# Patient Record
Sex: Male | Born: 2005
Health system: Southern US, Community
[De-identification: ages and names within clinical notes are randomized; demographics above are authoritative.]

## PROBLEM LIST (undated history)

## (undated) DIAGNOSIS — N433 Hydrocele, unspecified: Secondary | ICD-10-CM

## (undated) DIAGNOSIS — S022XXA Fracture of nasal bones, initial encounter for closed fracture: Secondary | ICD-10-CM

## (undated) HISTORY — DX: Hydrocele, unspecified: N43.3

## (undated) HISTORY — PX: HYDROCELE EXCISION / REPAIR: SUR1145

---
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
## 2024-02-03 ENCOUNTER — Telehealth: Payer: Self-pay | Admitting: Pediatrics

## 2024-02-03 NOTE — Telephone Encounter (Signed)
 Sports physical forms emailed over to be completed. Forms placed in Dr.Ram's office.   Will email the forms back to [email or web address] once completed.

## 2024-02-05 NOTE — Telephone Encounter (Signed)
Forms e-mailed back and placed up front in patient folders.

## 2024-05-02 NOTE — Progress Notes (Signed)
 05/03/2024 1:38 PM   Raymond Newton [birth year] [id number]  Referring provider: Hadassah Letters, MD [address] [place],  [place] [address]  No chief complaint on file.   HPI: 18 year old recently graduated high school senior (attending [employer]  next year) self-referred for problems with his foreskin.  He has had intermittent problems retracting his foreskin, as well as moderate inflammation at times.  In this active 18 year old, this is bothersome.   PMH: Past Medical History:  Diagnosis Date   Closed fracture nasal bone    Hydrocele of testis birth    Surgical History: Past Surgical History:  Procedure Laterality Date   CLOSED REDUCTION NASAL FRACTURE N/A 10/03/2020   Procedure: CLOSED REDUCTION NASAL FRACTURE;  Surgeon: Thornell Flirt, DO;  Location: [facility];  Service: Plastics;  Laterality: N/A;   HYDROCELE EXCISION / REPAIR      Home Medications:  Allergies as of 05/03/2024   No Known Allergies      Medication List    as of May 02, 2024  1:38 PM   You have not been prescribed any medications.     Allergies: No Known Allergies  Family History: Family History  Problem Relation Age of Onset   Cancer Paternal Grandmother        breast   Alcohol abuse Neg Hx    Arthritis Neg Hx    Asthma Neg Hx    Birth defects Neg Hx    COPD Neg Hx    Depression Neg Hx    Drug abuse Neg Hx    Early death Neg Hx    Hearing loss Neg Hx    Hyperlipidemia Neg Hx    Kidney disease Neg Hx    Learning disabilities Neg Hx    Mental illness Neg Hx    Mental retardation Neg Hx    Miscarriages / Stillbirths Neg Hx    Stroke Neg Hx    Vision loss Neg Hx    Varicose Veins Neg Hx    Heart disease Neg Hx    Hypertension Neg Hx     Social History:  reports that he has never smoked. He has never used smokeless tobacco. He reports that he does not drink alcohol and does not use drugs.  ROS: All other review of systems were  reviewed and are negative except what is noted above in HPI  Physical Exam: There were no vitals taken for this visit.  Constitutional:  Alert and oriented, No acute distress. HEENT: [place] AT, moist mucus membranes.  Trachea midline, no masses. Cardiovascular: No clubbing, cyanosis, or edema. Respiratory: Normal respiratory effort, no increased work of breathing. GI: No inguinal hernias GU: Phallus uncircumcised.  Mild phimosis.  No balanitis noted.  Scrotal skin normal.  Testicles, cord and epididymal structures normal. Lymph: No cervical or inguinal lymphadenopathy. Skin: No rashes, bruises or suspicious lesions. Neurologic: Grossly intact, no focal deficits, moving all 4 extremities. Psychiatric: Normal mood and affect.  Laboratory Data:    Assessment & Plan:   Phimosis with history of balanitis.  Patient desires to have circumcision.  I discussed circumcision with loop and his dad, Myrtie Atkinson.  We talked about the procedure, expected recovery, risks and complications.  He would like to move forward with performing a circumcision.  We will see about doing this in [place] at [facility]. There are no diagnoses linked to this encounter.  No follow-ups on file.  Roque Collar, MD  [facility]

## 2024-05-03 ENCOUNTER — Ambulatory Visit (INDEPENDENT_AMBULATORY_CARE_PROVIDER_SITE_OTHER): Payer: Self-pay | Admitting: Urology

## 2024-05-03 VITALS — BP 123/75 | HR 61

## 2024-05-03 DIAGNOSIS — N471 Phimosis: Secondary | ICD-10-CM | POA: Diagnosis not present

## 2024-05-03 DIAGNOSIS — Z87438 Personal history of other diseases of male genital organs: Secondary | ICD-10-CM

## 2024-05-04 ENCOUNTER — Other Ambulatory Visit: Payer: Self-pay

## 2024-05-04 DIAGNOSIS — N471 Phimosis: Secondary | ICD-10-CM

## 2024-05-12 NOTE — Patient Instructions (Addendum)
 SURGICAL WAITING ROOM VISITATION  Patients having surgery or a procedure may have no more than 2 support people in the waiting area - these visitors may rotate.    Children under the age of 50 must have an adult with them who is not the patient.  Visitors with respiratory illnesses are discouraged from visiting and should remain at home.  If the patient needs to stay at the hospital during part of their recovery, the visitor guidelines for inpatient rooms apply. Pre-op nurse will coordinate an appropriate time for 1 support person to accompany patient in pre-op.  This support person may not rotate.    Please refer to the [facility] website for the visitor guidelines for Inpatients (after your surgery is over and you are in a regular room).    Your procedure is scheduled on: 05/16/24   Report to [facility] Main Entrance    Report to admitting at 7:20 AM   Call this number if you have problems the morning of surgery [phone]   Do not eat food or drink liquids :After Midnight.         If you have questions, please contact your surgeon's office.   FOLLOW BOWEL PREP AND ANY ADDITIONAL PRE OP INSTRUCTIONS YOU RECEIVED FROM YOUR SURGEON'S OFFICE!!!     Oral Hygiene is also important to reduce your risk of infection.                                    Remember - BRUSH YOUR TEETH THE MORNING OF SURGERY WITH YOUR REGULAR TOOTHPASTE  DENTURES WILL BE REMOVED PRIOR TO SURGERY PLEASE DO NOT APPLY Poly grip OR ADHESIVES!!!   Stop all vitamins and herbal supplements 7 days before surgery.   Take these medicines the morning of surgery with A SIP OF WATER: none             You may not have any metal on your body including hair pins, jewelry, and body piercing             Do not wear make-up, lotions, powders, perfumes/cologne, or deodorant              Men may shave face and neck.   Do not bring valuables to the hospital. [facility] IS NOT             RESPONSIBLE   FOR  VALUABLES.   Contacts, glasses, dentures or bridgework may not be worn into surgery.   Bring small overnight bag day of surgery.   DO NOT BRING YOUR HOME MEDICATIONS TO THE HOSPITAL. PHARMACY WILL DISPENSE MEDICATIONS LISTED ON YOUR MEDICATION LIST TO YOU DURING YOUR ADMISSION IN THE HOSPITAL!    Patients discharged on the day of surgery will not be allowed to drive home.  Someone NEEDS to stay with you for the first 24 hours after anesthesia.              Please read over the following fact sheets you were given: IF YOU HAVE QUESTIONS ABOUT YOUR PRE-OP INSTRUCTIONS PLEASE CALL [phone]- Martin Slay   If you received a COVID test during your pre-op visit  it is requested that you wear a mask when out in public, stay away from anyone that may not be feeling well and notify your surgeon if you develop symptoms. If you test positive for Covid or have been in contact with anyone that has  tested positive in the last 10 days please notify you surgeon.    [facility] - Preparing for Surgery Before surgery, you can play an important role.  Because skin is not sterile, your skin needs to be as free of germs as possible.  You can reduce the number of germs on your skin by washing with CHG (chlorahexidine gluconate) soap before surgery.  CHG is an antiseptic cleaner which kills germs and bonds with the skin to continue killing germs even after washing. Please DO NOT use if you have an allergy to CHG or antibacterial soaps.  If your skin becomes reddened/irritated stop using the CHG and inform your nurse when you arrive at Short Stay. Do not shave (including legs and underarms) for at least 48 hours prior to the first CHG shower.  You may shave your face/neck.  Please follow these instructions carefully:  1.  Shower with CHG Soap the night before surgery and the  morning of surgery.  2.  If you choose to wash your hair, wash your hair first as usual with your normal  shampoo.  3.  After you  shampoo, rinse your hair and body thoroughly to remove the shampoo.                             4.  Use CHG as you would any other liquid soap.  You can apply chg directly to the skin and wash.  Gently with a scrungie or clean washcloth.  5.  Apply the CHG Soap to your body ONLY FROM THE NECK DOWN.   Do   not use on face/ open                           Wound or open sores. Avoid contact with eyes, ears mouth and   genitals (private parts).                       Wash face,  Genitals (private parts) with your normal soap.             6.  Wash thoroughly, paying special attention to the area where your    surgery  will be performed.  7.  Thoroughly rinse your body with warm water from the neck down.  8.  DO NOT shower/wash with your normal soap after using and rinsing off the CHG Soap.                9.  Pat yourself dry with a clean towel.            10.  Wear clean pajamas.            11.  Place clean sheets on your bed the night of your first shower and do not  sleep with pets. Day of Surgery : Do not apply any lotions/deodorants the morning of surgery.  Please wear clean clothes to the hospital/surgery center.  FAILURE TO FOLLOW THESE INSTRUCTIONS MAY RESULT IN THE CANCELLATION OF YOUR SURGERY  PATIENT SIGNATURE_________________________________  NURSE SIGNATURE__________________________________  ________________________________________________________________________

## 2024-05-12 NOTE — Progress Notes (Addendum)
 COVID Vaccine Completed: yes  Date of COVID positive in last 90 days:  PCP - Hadassah Letters, MD Cardiologist - n/a  Chest x-Eisenmenger - n/a EKG - n/a Stress Test - n/a ECHO - n/a Cardiac Cath - n/a Pacemaker/ICD device last checked: n/a Spinal Cord Stimulator:n/a  Bowel Prep - no  Sleep Study - n/a CPAP -   Fasting Blood Sugar -  Checks Blood Sugar _____ times a day  Last dose of GLP1 agonist-  N/A GLP1 instructions:  Hold 7 days before surgery    Last dose of SGLT-2 inhibitors-  N/A SGLT-2 instructions:  Hold 3 days before surgery    Blood Thinner Instructions:  Last dose:  n/a  Time: Aspirin Instructions: Last Dose:  Activity level: Can go up a flight of stairs and perform activities of daily living without stopping and without symptoms of chest pain or shortness of breath.   Anesthesia review:   Patient denies shortness of breath, fever, cough and chest pain at PAT appointment  Patient verbalized understanding of instructions that were given to them at the PAT appointment. Patient was also instructed that they will need to review over the PAT instructions again at home before surgery.

## 2024-05-13 ENCOUNTER — Other Ambulatory Visit: Payer: Self-pay

## 2024-05-13 ENCOUNTER — Encounter (HOSPITAL_COMMUNITY)
Admission: RE | Admit: 2024-05-13 | Discharge: 2024-05-13 | Disposition: A | Discharge status: Home / Self Care | Source: Ambulatory Visit | Attending: Urology | Admitting: Urology

## 2024-05-13 ENCOUNTER — Encounter (HOSPITAL_COMMUNITY): Payer: Self-pay

## 2024-05-13 VITALS — BP 127/57 | HR 57 | Temp 98.6°F | Resp 14 | Ht 75.0 in | Wt 189.0 lb

## 2024-05-13 DIAGNOSIS — N471 Phimosis: Secondary | ICD-10-CM | POA: Diagnosis present

## 2024-05-13 DIAGNOSIS — Z01812 Encounter for preprocedural laboratory examination: Secondary | ICD-10-CM | POA: Insufficient documentation

## 2024-05-13 DIAGNOSIS — Z01818 Encounter for other preprocedural examination: Secondary | ICD-10-CM

## 2024-05-15 NOTE — H&P (Signed)
 H&P  Chief Complaint: phimosis  History of Present Illness: 18 yo male presents for circumcision for moderate symptomatic phimosis.  Past Medical History:  Diagnosis Date   Closed fracture nasal bone    Hydrocele of testis birth    Past Surgical History:  Procedure Laterality Date   CLOSED REDUCTION NASAL FRACTURE N/A 10/03/2020   Procedure: CLOSED REDUCTION NASAL FRACTURE;  Surgeon: Thornell Flirt, DO;  Location: [facility];  Service: Plastics;  Laterality: N/A;   HYDROCELE EXCISION / REPAIR     WISDOM TOOTH EXTRACTION      Home Medications:  Allergies as of 05/15/2024   No Known Allergies      Medication List      Notice   Cannot display discharge medications because the patient has not yet been admitted.     Allergies: No Known Allergies  Family History  Problem Relation Age of Onset   Cancer Paternal Grandmother        breast   Alcohol abuse Neg Hx    Arthritis Neg Hx    Asthma Neg Hx    Birth defects Neg Hx    COPD Neg Hx    Depression Neg Hx    Drug abuse Neg Hx    Early death Neg Hx    Hearing loss Neg Hx    Hyperlipidemia Neg Hx    Kidney disease Neg Hx    Learning disabilities Neg Hx    Mental illness Neg Hx    Mental retardation Neg Hx    Miscarriages / Stillbirths Neg Hx    Stroke Neg Hx    Vision loss Neg Hx    Varicose Veins Neg Hx    Heart disease Neg Hx    Hypertension Neg Hx     Social History:  reports that he has never smoked. He has never used smokeless tobacco. He reports that he does not drink alcohol and does not use drugs.  ROS: A complete review of systems was performed.  All systems are negative except for pertinent findings as noted.  Physical Exam:  Vital signs in last 24 hours: There were no vitals taken for this visit. Constitutional:  Alert and oriented, No acute distress Cardiovascular: Regular rate  Respiratory: Normal respiratory effort GI: Abdomen is soft, nontender, nondistended, no  abdominal masses. No CVAT.  Genitourinary: Phimotic foreskin. Lymphatic: No lymphadenopathy Neurologic: Grossly intact, no focal deficits Psychiatric: Normal mood and affect  I have reviewed prior pt notes     Impression/Assessment:  Phimosis  Plan:  Circumcision

## 2024-05-16 ENCOUNTER — Encounter (HOSPITAL_COMMUNITY): Payer: Self-pay | Admitting: Urology

## 2024-05-16 ENCOUNTER — Ambulatory Visit (HOSPITAL_COMMUNITY): Admitting: Anesthesiology

## 2024-05-16 ENCOUNTER — Ambulatory Visit (HOSPITAL_COMMUNITY): Payer: Self-pay | Admitting: Physician Assistant

## 2024-05-16 ENCOUNTER — Encounter (HOSPITAL_COMMUNITY)
Admission: RE | Disposition: A | Discharge status: Home / Self Care | Payer: Self-pay | Source: Home / Self Care | Attending: Urology

## 2024-05-16 ENCOUNTER — Ambulatory Visit (HOSPITAL_COMMUNITY)
Admission: RE | Admit: 2024-05-16 | Discharge: 2024-05-16 | Disposition: A | Discharge status: Home / Self Care | Attending: Urology | Admitting: Urology

## 2024-05-16 DIAGNOSIS — N471 Phimosis: Secondary | ICD-10-CM | POA: Insufficient documentation

## 2024-05-16 HISTORY — PX: CIRCUMCISION: SHX1350

## 2024-05-16 SURGERY — CIRCUMCISION, ADULT
Anesthesia: General | Site: Penis

## 2024-05-16 MED ORDER — LACTATED RINGERS IV SOLN: INTRAVENOUS | Status: DC

## 2024-05-16 MED ORDER — FENTANYL CITRATE PF 50 MCG/ML IJ SOSY: 25.0000 ug | PREFILLED_SYRINGE | INTRAMUSCULAR | Status: DC | PRN

## 2024-05-16 MED ORDER — ONDANSETRON HCL 4 MG/2ML IJ SOLN
INTRAMUSCULAR | Status: AC
  Filled 2024-05-16: qty 2

## 2024-05-16 MED ORDER — LIDOCAINE HCL (PF) 1 % IJ SOLN
INTRAMUSCULAR | Status: AC
Start: 2024-05-16 — End: 2024-05-16
  Filled 2024-05-16: qty 30

## 2024-05-16 MED ORDER — PROPOFOL 10 MG/ML IV BOLUS
INTRAVENOUS | Status: AC
  Filled 2024-05-16: qty 20

## 2024-05-16 MED ORDER — FENTANYL CITRATE (PF) 100 MCG/2ML IJ SOLN
INTRAMUSCULAR | Status: DC | PRN
  Administered 2024-05-16 (×2): 50 ug via INTRAVENOUS

## 2024-05-16 MED ORDER — BUPIVACAINE HCL (PF) 0.25 % IJ SOLN
INTRAMUSCULAR | Status: DC | PRN
  Administered 2024-05-16: 8 mL

## 2024-05-16 MED ORDER — PROPOFOL 10 MG/ML IV BOLUS
INTRAVENOUS | Status: DC | PRN
  Administered 2024-05-16: 200 mg via INTRAVENOUS

## 2024-05-16 MED ORDER — LIDOCAINE HCL (PF) 2 % IJ SOLN
INTRAMUSCULAR | Status: DC | PRN
  Administered 2024-05-16: 80 mg via INTRADERMAL

## 2024-05-16 MED ORDER — ONDANSETRON HCL 4 MG/2ML IJ SOLN
INTRAMUSCULAR | Status: DC | PRN
  Administered 2024-05-16: 4 mg via INTRAVENOUS

## 2024-05-16 MED ORDER — LIDOCAINE HCL (PF) 2 % IJ SOLN
INTRAMUSCULAR | Status: AC
  Filled 2024-05-16: qty 5

## 2024-05-16 MED ORDER — AMISULPRIDE (ANTIEMETIC) 5 MG/2ML IV SOLN
10.0000 mg | Freq: Once | INTRAVENOUS | Status: DC | PRN
Start: 2024-05-16 — End: 2024-05-16

## 2024-05-16 MED ORDER — FENTANYL CITRATE (PF) 100 MCG/2ML IJ SOLN
INTRAMUSCULAR | Status: AC
Start: 2024-05-16 — End: 2024-05-16
  Filled 2024-05-16: qty 2

## 2024-05-16 MED ORDER — DEXMEDETOMIDINE HCL IN NACL 80 MCG/20ML IV SOLN
INTRAVENOUS | Status: DC | PRN
  Administered 2024-05-16: 8 ug via INTRAVENOUS

## 2024-05-16 MED ORDER — OXYCODONE HCL 5 MG/5ML PO SOLN: 5.0000 mg | Freq: Once | ORAL | Status: DC | PRN

## 2024-05-16 MED ORDER — OXYCODONE HCL 5 MG PO TABS: 5.0000 mg | ORAL_TABLET | Freq: Once | ORAL | Status: DC | PRN

## 2024-05-16 MED ORDER — ORAL CARE MOUTH RINSE
15.0000 mL | Freq: Once | OROMUCOSAL | Status: AC
Start: 2024-05-16 — End: 2024-05-16

## 2024-05-16 MED ORDER — CEFAZOLIN SODIUM-DEXTROSE 2-4 GM/100ML-% IV SOLN
2.0000 g | INTRAVENOUS | Status: AC
  Filled 2024-05-16: qty 100

## 2024-05-16 MED ORDER — LIDOCAINE HCL (PF) 1 % IJ SOLN
INTRAMUSCULAR | Status: DC | PRN
  Administered 2024-05-16: 8 mL

## 2024-05-16 MED ORDER — 0.9 % SODIUM CHLORIDE (POUR BTL) OPTIME
TOPICAL | Status: DC | PRN
  Administered 2024-05-16: 1000 mL

## 2024-05-16 MED ORDER — MIDAZOLAM HCL 2 MG/2ML IJ SOLN
INTRAMUSCULAR | Status: DC | PRN
  Administered 2024-05-16: 2 mg via INTRAVENOUS

## 2024-05-16 MED ORDER — DEXAMETHASONE SODIUM PHOSPHATE 10 MG/ML IJ SOLN
INTRAMUSCULAR | Status: AC
  Filled 2024-05-16: qty 1

## 2024-05-16 MED ORDER — DEXAMETHASONE SODIUM PHOSPHATE 10 MG/ML IJ SOLN
INTRAMUSCULAR | Status: DC | PRN
  Administered 2024-05-16: 4 mg via INTRAVENOUS

## 2024-05-16 MED ORDER — CHLORHEXIDINE GLUCONATE 0.12 % MT SOLN
15.0000 mL | Freq: Once | OROMUCOSAL | Status: AC
  Administered 2024-05-16: 15 mL via OROMUCOSAL

## 2024-05-16 MED ORDER — TRAMADOL HCL 50 MG PO TABS: 50.0000 mg | ORAL_TABLET | Freq: Four times a day (QID) | ORAL | 0 refills | Status: AC | PRN

## 2024-05-16 MED ORDER — ACETAMINOPHEN 500 MG PO TABS
1000.0000 mg | ORAL_TABLET | Freq: Once | ORAL | Status: AC
  Administered 2024-05-16: 1000 mg via ORAL
  Filled 2024-05-16: qty 2

## 2024-05-16 MED ORDER — DEXMEDETOMIDINE HCL IN NACL 80 MCG/20ML IV SOLN
INTRAVENOUS | Status: AC
  Filled 2024-05-16: qty 20

## 2024-05-16 MED ORDER — BUPIVACAINE HCL (PF) 0.25 % IJ SOLN
INTRAMUSCULAR | Status: AC
  Filled 2024-05-16: qty 30

## 2024-05-16 MED ORDER — MIDAZOLAM HCL 2 MG/2ML IJ SOLN
INTRAMUSCULAR | Status: AC
Start: 2024-05-16 — End: 2024-05-16
  Filled 2024-05-16: qty 2

## 2024-05-16 SURGICAL SUPPLY — 24 items
BAG COUNTER SPONGE SURGICOUNT (BAG) IMPLANT
BLADE SURG 15 STRL LF DISP TIS (BLADE) ×1 IMPLANT
BNDG COHESIVE 1.5X5 TAN NS LF (GAUZE/BANDAGES/DRESSINGS) ×1 IMPLANT
BNDG COHESIVE 2X5 TAN ST LF (GAUZE/BANDAGES/DRESSINGS) IMPLANT
BNDG STRETCH 4X75 STRL LF (GAUZE/BANDAGES/DRESSINGS) IMPLANT
COVER SURGICAL LIGHT HANDLE (MISCELLANEOUS) ×1 IMPLANT
DRAPE LAPAROTOMY T 102X78X121 (DRAPES) ×1 IMPLANT
ELECT NDL TIP 2.8 STRL (NEEDLE) IMPLANT
ELECT NEEDLE TIP 2.8 STRL (NEEDLE) IMPLANT
ELECT REM PT RETURN 15FT ADLT (MISCELLANEOUS) ×1 IMPLANT
GAUZE 4X4 16PLY ~~LOC~~+RFID DBL (SPONGE) ×1 IMPLANT
GAUZE PETROLATUM 1 X8 (GAUZE/BANDAGES/DRESSINGS) ×1 IMPLANT
GAUZE STRETCH 2X75IN STRL (MISCELLANEOUS) ×1 IMPLANT
GLOVE SURG LX STRL 8.0 MICRO (GLOVE) ×1 IMPLANT
GOWN STRL REUS W/ TWL XL LVL3 (GOWN DISPOSABLE) ×1 IMPLANT
KIT BASIN OR (CUSTOM PROCEDURE TRAY) ×1 IMPLANT
KIT TURNOVER KIT A (KITS) ×2 IMPLANT
NDL HYPO 22X1.5 SAFETY MO (MISCELLANEOUS) IMPLANT
NEEDLE HYPO 22X1.5 SAFETY MO (MISCELLANEOUS) IMPLANT
NS IRRIG 1000ML POUR BTL (IV SOLUTION) IMPLANT
PACK BASIC VI WITH GOWN DISP (CUSTOM PROCEDURE TRAY) ×1 IMPLANT
PENCIL SMOKE EVACUATOR (MISCELLANEOUS) IMPLANT
SUT CHROMIC 4 0 RB 1X27 (SUTURE) ×2 IMPLANT
SYR CONTROL 10ML LL (SYRINGE) IMPLANT

## 2024-05-16 NOTE — Discharge Instructions (Signed)
 Postoperative instructions for circumcision  Wound:  Remove the dressing 2 mornings after surgery. In most cases your incision will have absorbable sutures that run along the course of your incision and will dissolve within the first 10-20 days. Some will fall out even earlier. Expect some redness as the sutures dissolved but this should occur only around the sutures. If there is generalized redness, especially with increasing pain or swelling, let us  know. The penis will possibly get black and blue as the blood in the tissues spread. Sometimes the whole penis will turn colors. The black and blue is followed by a yellow and brown color. In time, all the discoloration will go away.  Diet:  You may return to your normal diet within 24 hours following your surgery. You may note some mild nausea and possibly vomiting the first 6-8 hours following surgery. This is usually due to the side effects of anesthesia, and will disappear quite soon. I would suggest clear liquids and a very light meal the first evening following your surgery.  Activity:  Your physical activity should be restricted the first 48 hours. During that time you should remain relatively inactive, moving about only when necessary. During the first 7-10 days following surgery he should avoid lifting any heavy objects (anything greater than 15 pounds), and avoid strenuous exercise. If you work, ask us  specifically about your restrictions, both for work and home. We will write a note to your employer if needed.  Ice packs can be placed on and off over the penis for the first 48 hours to help relieve the pain and keep the swelling down. Frozen peas or corn in a ZipLock bag can be frozen, used and re-frozen. Fifteen minutes on and 15 minutes off is a reasonable schedule.  No sexual activity for 1 month.  Hygiene:  You may shower 24 hours after your surgery. Make sure wound is clean and dry afterwards. Tub bathing should be restricted until  the seventh day.  Medication:  You will be sent home with some type of pain medication.  If the pain is not too bad, you may take either Tylenol  (acetaminophen ) or Advil (ibuprofen) which contain no narcotic agents, and might be tolerated a little better, with fewer side effects. If the pain medication you are sent home with does not control the pain, you will have to let us  know. Some narcotic pain medications cannot be given or refilled by a phone call to a pharmacy.  Problems you should report to us :  Fever of 101.0 degrees Fahrenheit or greater. Moderate or severe swelling under the skin incision or involving the scrotum. Drug reaction such as hives, a rash, nausea or vomiting.  Difficulty voiding

## 2024-05-16 NOTE — Op Note (Signed)
 Preoperative diagnosis: Phimosis Postoperative diagnosis: Same  Procedure: Circumcision Surgeon: Malcolm Scrivener. Yunique Dearcos, MD. Anesthesia: Gen. with penile block Specimen: foreskin Complications: none EBL: 25 mL  Indications: The patient was recently evaluated for phimosis. All risks and benefits of circumcision discussed. Full informed consent obtained. The patient now presents for definitive procedure.  Technique and findings: The patient was brought to the operating room. Successful induction of general anesthesia.  The patient was then prepped and draped in usual manner. Appropriate surgical timeout was performed. A penile block was then performed with 16cc of quarter percent plain Marcaine. Proximal and distal incision sites were marked with a pen, and appropriate circumferential incisions were created. The sleeve of redundant skin was removed with the bovie. Hemostatis was achieved using electrocautery.Quadrant sutures were placed with interrupted 4-0 chromic, with the phrenular suture being a U stitch. In between, the same 4-0 chromic was used to re approximate the skin edges with a running simple stitch.  The incision was wrapped with Xeroform gauze, a plain guaze wrap and Coban dressing. The patient was brought to recovery room in stable condition having had no obvious complications or problems. Sponge and needle counts were correct.

## 2024-05-16 NOTE — Anesthesia Preprocedure Evaluation (Addendum)
 Anesthesia Evaluation  Patient identified by MRN, date of birth, ID band Patient awake    Reviewed: Allergy & Precautions, NPO status , Patient's Chart, lab work & pertinent test results  Airway Mallampati: II  TM Distance: >3 FB Neck ROM: Full    Dental no notable dental hx. (+) Teeth Intact, Dental Advisory Given   Pulmonary neg pulmonary ROS   Pulmonary exam normal breath sounds clear to auscultation       Cardiovascular negative cardio ROS Normal cardiovascular exam Rhythm:Regular Rate:Normal     Neuro/Psych negative neurological ROS  negative psych ROS   GI/Hepatic negative GI ROS, Neg liver ROS,,,  Endo/Other  negative endocrine ROS    Renal/GU negative Renal ROS  negative genitourinary   Musculoskeletal negative musculoskeletal ROS (+)    Abdominal   Peds  Hematology negative hematology ROS (+)   Anesthesia Other Findings   Reproductive/Obstetrics                             Anesthesia Physical Anesthesia Plan  ASA: 1  Anesthesia Plan: General   Post-op Pain Management: Tylenol  PO (pre-op)*, Precedex and Dilaudid IV   Induction: Intravenous  PONV Risk Score and Plan: 2 and Ondansetron , Dexamethasone  and Midazolam   Airway Management Planned: LMA  Additional Equipment:   Intra-op Plan:   Post-operative Plan: Extubation in OR  Informed Consent: I have reviewed the patients History and Physical, chart, labs and discussed the procedure including the risks, benefits and alternatives for the proposed anesthesia with the patient or authorized representative who has indicated his/her understanding and acceptance.     Dental advisory given  Plan Discussed with: CRNA  Anesthesia Plan Comments:        Anesthesia Quick Evaluation

## 2024-05-16 NOTE — Anesthesia Procedure Notes (Signed)
 Procedure Name: LMA Insertion Date/Time: 05/16/2024 9:35 AM  Performed by: Ulanda Gambles, CRNAPre-anesthesia Checklist: Patient identified, Emergency Drugs available, Suction available and Patient being monitored Patient Re-evaluated:Patient Re-evaluated prior to induction Oxygen Delivery Method: Circle system utilized Preoxygenation: Pre-oxygenation with 100% oxygen Induction Type: IV induction Ventilation: Mask ventilation without difficulty LMA: LMA inserted LMA Size: 5.0 Number of attempts: 2 Dental Injury: Teeth and Oropharynx as per pre-operative assessment  Comments: LMA 4 with gastric port placed, unable to seat properly, switched to LMA 5

## 2024-05-16 NOTE — Anesthesia Postprocedure Evaluation (Signed)
 Anesthesia Post Note  Patient: Raymond Newton  Procedure(s) Performed: CIRCUMCISION, ADULT (Penis)     Patient location during evaluation: PACU Anesthesia Type: General Level of consciousness: awake and alert Pain management: pain level controlled Vital Signs Assessment: post-procedure vital signs reviewed and stable Respiratory status: spontaneous breathing, nonlabored ventilation, respiratory function stable and patient connected to nasal cannula oxygen Cardiovascular status: blood pressure returned to baseline and stable Postop Assessment: no apparent nausea or vomiting Anesthetic complications: no  No notable events documented.  Last Vitals:  Vitals:   05/16/24 1045 05/16/24 1100  BP: 114/74 118/72  Pulse: 62 62  Resp: 18 15  Temp:    SpO2: 100% 100%    Last Pain:  Vitals:   05/16/24 1100  TempSrc:   PainSc: 0-No pain                 Dejan Angert L Malikye Reppond

## 2024-05-16 NOTE — Transfer of Care (Signed)
 Immediate Anesthesia Transfer of Care Note  Patient: Raymond Newton  Procedure(s) Performed: CIRCUMCISION, ADULT (Penis)  Patient Location: PACU  Anesthesia Type:General  Level of Consciousness: drowsy  Airway & Oxygen Therapy: Patient Spontanous Breathing and Patient connected to face mask oxygen  Post-op Assessment: Report given to RN, Post -op Vital signs reviewed and stable, and Patient moving all extremities X 4  Post vital signs: Reviewed and stable  Last Vitals:  Vitals Value Taken Time  BP 112/53 05/16/24 10:31  Temp    Pulse 51 05/16/24 10:32  Resp 18 05/16/24 10:32  SpO2 100 % 05/16/24 10:32  Vitals shown include unfiled device data.  Last Pain:  Vitals:   05/16/24 0807  TempSrc:   PainSc: 0-No pain         Complications: No notable events documented.

## 2024-05-17 ENCOUNTER — Encounter (HOSPITAL_COMMUNITY): Payer: Self-pay | Admitting: Urology

## 2024-05-18 LAB — SURGICAL PATHOLOGY, GROSS ONLY (NOT ARMC)

## 2024-05-19 ENCOUNTER — Telehealth: Payer: Self-pay | Admitting: Pediatrics

## 2024-05-19 NOTE — Telephone Encounter (Signed)
 Pt's guardian dropped off a [employer] Required Immunizations form to be filled out and was informed that PCP is out of office until 05/23/24 and it can take 3-5 business days before it will be finished. Pt's guardian verbalized agreement/understanding and asked to be called when it's done (they do not need the form soon).  Form placed in PCP's office.

## 2024-05-23 NOTE — Telephone Encounter (Signed)
 Child medical report filled and given to front desk

## 2024-05-23 NOTE — Telephone Encounter (Signed)
 Called pt's mom & confirmed email address. Sent & told to contact us  if they have issues opening it.

## 2024-06-13 NOTE — Progress Notes (Signed)
   History of Present Illness: Raymond Newton is here today for follow-up.  He underwent circumcision for phimosis on June 16.  Past Medical History:  Diagnosis Date   Closed fracture nasal bone    Hydrocele of testis birth    Past Surgical History:  Procedure Laterality Date   CIRCUMCISION N/A 05/16/2024   Procedure: CIRCUMCISION, ADULT;  Surgeon: Matilda Senior, MD;  Location: [facility];  Service: Urology;  Laterality: N/A;   CLOSED REDUCTION NASAL FRACTURE N/A 10/03/2020   Procedure: CLOSED REDUCTION NASAL FRACTURE;  Surgeon: Lowery Estefana RAMAN, DO;  Location: [facility];  Service: Plastics;  Laterality: N/A;   HYDROCELE EXCISION / REPAIR     WISDOM TOOTH EXTRACTION      Home Medications:  Allergies as of 06/14/2024   No Known Allergies      Medication List        Accurate as of June 13, 2024 12:24 PM. If you have any questions, ask your nurse or doctor.          traMADol  50 MG tablet Commonly known as: Ultram  Take 1 tablet (50 mg total) by mouth every 6 (six) hours as needed for moderate pain (pain score 4-6) or severe pain (pain score 7-10).        Allergies: No Known Allergies  Family History  Problem Relation Age of Onset   Cancer Paternal Grandmother        breast   Alcohol abuse Neg Hx    Arthritis Neg Hx    Asthma Neg Hx    Birth defects Neg Hx    COPD Neg Hx    Depression Neg Hx    Drug abuse Neg Hx    Early death Neg Hx    Hearing loss Neg Hx    Hyperlipidemia Neg Hx    Kidney disease Neg Hx    Learning disabilities Neg Hx    Mental illness Neg Hx    Mental retardation Neg Hx    Miscarriages / Stillbirths Neg Hx    Stroke Neg Hx    Vision loss Neg Hx    Varicose Veins Neg Hx    Heart disease Neg Hx    Hypertension Neg Hx     Social History:  reports that he has never smoked. He has never used smokeless tobacco. He reports that he does not drink alcohol and does not use drugs.  ROS: A complete review of systems was performed.   All systems are negative except for pertinent findings as noted.  Physical Exam:    Genitourinary:    Impression/Assessment:  ***  Plan:  ***

## 2024-06-14 ENCOUNTER — Ambulatory Visit (INDEPENDENT_AMBULATORY_CARE_PROVIDER_SITE_OTHER): Admitting: Urology

## 2024-06-14 VITALS — BP 150/74 | HR 62

## 2024-06-14 DIAGNOSIS — N471 Phimosis: Secondary | ICD-10-CM

## 2024-06-14 DIAGNOSIS — Z87438 Personal history of other diseases of male genital organs: Secondary | ICD-10-CM

## 2024-06-14 DIAGNOSIS — Z09 Encounter for follow-up examination after completed treatment for conditions other than malignant neoplasm: Secondary | ICD-10-CM | POA: Diagnosis not present

## 2024-11-29 NOTE — Progress Notes (Unsigned)
" ° °  History of Present Illness: Raymond Newton is here today for follow-up.  He underwent circumcision for phimosis on June 16.  He feels that he has healed well, but does have some questions about his circumcision removal.  Past Medical History:  Diagnosis Date   Closed fracture nasal bone    Hydrocele of testis birth    Past Surgical History:  Procedure Laterality Date   CIRCUMCISION N/A 05/16/2024   Procedure: CIRCUMCISION, ADULT;  Surgeon: Raymond Senior, MD;  Location: [facility];  Service: Urology;  Laterality: N/A;   CLOSED REDUCTION NASAL FRACTURE N/A 10/03/2020   Procedure: CLOSED REDUCTION NASAL FRACTURE;  Surgeon: Raymond Estefana RAMAN, DO;  Location: [facility];  Service: Plastics;  Laterality: N/A;   HYDROCELE EXCISION / REPAIR     WISDOM TOOTH EXTRACTION      Home Medications:  Allergies as of 11/30/2024   No Known Allergies      Medication List        Accurate as of November 29, 2024  7:14 PM. If you have any questions, ask your nurse or doctor.          traMADol  50 MG tablet Commonly known as: Ultram  Take 1 tablet (50 mg total) by mouth every 6 (six) hours as needed for moderate pain (pain score 4-6) or severe pain (pain score 7-10).        Allergies: No Known Allergies  Family History  Problem Relation Age of Onset   Cancer Paternal Grandmother        breast   Alcohol abuse Neg Hx    Arthritis Neg Hx    Asthma Neg Hx    Birth defects Neg Hx    COPD Neg Hx    Depression Neg Hx    Drug abuse Neg Hx    Early death Neg Hx    Hearing loss Neg Hx    Hyperlipidemia Neg Hx    Kidney disease Neg Hx    Learning disabilities Neg Hx    Mental illness Neg Hx    Mental retardation Neg Hx    Miscarriages / Stillbirths Neg Hx    Stroke Neg Hx    Vision loss Neg Hx    Varicose Veins Neg Hx    Heart disease Neg Hx    Hypertension Neg Hx     Social History:  reports that he has never smoked. He has never used smokeless tobacco. He reports that  he does not drink alcohol and does not use drugs.  ROS: A complete review of systems was performed.  All systems are negative except for pertinent findings as noted.  Physical Exam:    Genitourinary: Circumcision wound well-healed.  Still with some tissue edema in the frenular area just lateral to the midline bilaterally.   Impression/Assessment:  Status post circumcision for phimosis, healing well.  He does have some continued edema laterally in the frenular area  Plan:  -Raymond Newton was reassured about his healing  - If he is concerned about some of the edema, I would recommend putting a compressive band around the distal penis occasionally to help remove some of that fluid out  - Otherwise return as needed  "

## 2024-11-30 ENCOUNTER — Ambulatory Visit: Admitting: Urology

## 2024-11-30 VITALS — BP 117/65 | HR 52

## 2024-11-30 DIAGNOSIS — N4889 Other specified disorders of penis: Secondary | ICD-10-CM

## 2024-11-30 DIAGNOSIS — Z4889 Encounter for other specified surgical aftercare: Secondary | ICD-10-CM
